# Patient Record
Sex: Female | Born: 2000 | Race: White | Hispanic: No | Marital: Single | Smoking: Never smoker
Health system: Southern US, Community
[De-identification: ages and names within clinical notes are randomized; demographics above are authoritative.]

## PROBLEM LIST (undated history)

## (undated) ENCOUNTER — Ambulatory Visit: Admission: EM

## (undated) DIAGNOSIS — L709 Acne, unspecified: Secondary | ICD-10-CM

## (undated) HISTORY — PX: NO PAST SURGERIES: SHX2092

---
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
## 2019-05-01 ENCOUNTER — Other Ambulatory Visit: Payer: Self-pay

## 2019-05-01 MED ORDER — DROSPIRENONE-ETHINYL ESTRADIOL 3-0.02 MG PO TABS: 1.0000 | ORAL_TABLET | Freq: Every day | ORAL | 0 refills | Status: DC

## 2019-05-01 NOTE — Telephone Encounter (Signed)
Pt will run out of OCP on Saturday 05/06/2019. Her apt for AE is on Monday 05/08/2019. Refill needed. 1 rf sent.

## 2019-05-05 NOTE — Progress Notes (Signed)
PCP:  Patient, No Pcp Per   Chief Complaint  Patient presents with  . Gynecologic Exam     HPI:      Ashley Rodgers is a 18 y.o. G0P0000 who LMP was Patient's last menstrual period was 05/04/2019 (exact date)., presents today for 1 yr BC follow up for dysmenorrhea/accutane use.  Her menses are regular every 28-30 days, lasting 4-5 days, flow improved.  Dysmenorrhea mild, improved with OCPs. Takes NSAIDs with improvement. She does not have intermenstrual bleeding. Likes OCPs overall. No side effects. No longer on accutane.  Sex activity: never sexually active.  Last Pap: N/A Hx of STDs: none  There is no FH of breast cancer. There is no FH of ovarian cancer. She does not do SBE.  Tobacco use: The patient denies current or previous tobacco use. Alcohol use: none No drug use.  Exercise: moderately active  She does get adequate calcium and Vitamin D in her diet. Gardasil declined in past.  Past Medical History:  Diagnosis Date  . Acne     Past Surgical History:  Procedure Laterality Date  . NO PAST SURGERIES      Family History  Problem Relation Age of Onset  . Dysmenorrhea Mother   . Anxiety disorder Father   . Breast cancer Neg Hx   . Ovarian cancer Neg Hx     Social History   Socioeconomic History  . Marital status: Single    Spouse name: Not on file  . Number of children: Not on file  . Years of education: Not on file  . Highest education level: Not on file  Occupational History  . Not on file  Social Needs  . Financial resource strain: Not on file  . Food insecurity    Worry: Not on file    Inability: Not on file  . Transportation needs    Medical: Not on file    Non-medical: Not on file  Tobacco Use  . Smoking status: Never Smoker  . Smokeless tobacco: Never Used  Substance and Sexual Activity  . Alcohol use: Never    Frequency: Never  . Drug use: Never  . Sexual activity: Never    Birth control/protection: Pill  Lifestyle  .  Physical activity    Days per week: Not on file    Minutes per session: Not on file  . Stress: Not on file  Relationships  . Social Musicianconnections    Talks on phone: Not on file    Gets together: Not on file    Attends religious service: Not on file    Active member of club or organization: Not on file    Attends meetings of clubs or organizations: Not on file    Relationship status: Not on file  . Intimate partner violence    Fear of current or ex partner: Not on file    Emotionally abused: Not on file    Physically abused: Not on file    Forced sexual activity: Not on file  Other Topics Concern  . Not on file  Social History Narrative  . Not on file    Outpatient Medications Prior to Visit  Medication Sig Dispense Refill  . drospirenone-ethinyl estradiol (YAZ) 3-0.02 MG tablet Take 1 tablet by mouth daily. 1 Package 0  . ipratropium (ATROVENT) 0.06 % nasal spray Place 2 sprays into both nostrils 4 (four) times daily as needed for rhinitis. 15 mL 0  . ISOTRETINOIN PO Take by mouth.  No facility-administered medications prior to visit.     ROS:  Review of Systems  Constitutional: Negative for fatigue, fever and unexpected weight change.  Respiratory: Negative for cough, shortness of breath and wheezing.   Cardiovascular: Negative for chest pain, palpitations and leg swelling.  Gastrointestinal: Negative for blood in stool, constipation, diarrhea, nausea and vomiting.  Endocrine: Negative for cold intolerance, heat intolerance and polyuria.  Genitourinary: Negative for dyspareunia, dysuria, flank pain, frequency, genital sores, hematuria, menstrual problem, pelvic pain, urgency, vaginal bleeding, vaginal discharge and vaginal pain.  Musculoskeletal: Negative for back pain, joint swelling and myalgias.  Skin: Negative for rash.  Neurological: Negative for dizziness, syncope, light-headedness, numbness and headaches.  Hematological: Negative for adenopathy.   Psychiatric/Behavioral: Negative for agitation, confusion, sleep disturbance and suicidal ideas. The patient is not nervous/anxious.    BREAST: No symptoms   Objective: BP 102/68   Ht 5\' 10"  (1.778 m)   Wt 140 lb 9.6 oz (63.8 kg)   LMP 05/04/2019 (Exact Date)   BMI 20.17 kg/m    Physical Exam Constitutional:      Appearance: She is well-developed.  Neck:     Musculoskeletal: Normal range of motion.  Pulmonary:     Effort: Pulmonary effort is normal.  Musculoskeletal: Normal range of motion.  Neurological:     Mental Status: She is alert and oriented to person, place, and time.  Skin:    General: Skin is warm.  Psychiatric:        Behavior: Behavior normal.        Thought Content: Thought content normal.   GYN EXAM DEFERRED SINCE NEVER SEX ACTIVE   Assessment/Plan: Encounter for surveillance of contraceptive pills - Plan: drospirenone-ethinyl estradiol (YAZ) 3-0.02 MG tablet, Doing well, wants to cont. Rx RF. F/u prn.  Dysmenorrhea in adolescent - Plan: drospirenone-ethinyl estradiol (YAZ) 3-0.02 MG tablet, Improved with OCPs.   Meds ordered this encounter  Medications  . drospirenone-ethinyl estradiol (YAZ) 3-0.02 MG tablet    Sig: Take 1 tablet by mouth daily.    Dispense:  3 Package    Refill:  3    Order Specific Question:   Supervising Provider    Answer:   Gae Dry [[id number]]             GYN counsel use and side effects of OCP's, adequate intake of calcium and vitamin D, Gardasil     F/U  Return in about 1 year (around 05/07/2020).  Khing Belcher B. Ithzel Fedorchak, PA-C 05/08/2019 8:35 AM

## 2019-05-08 ENCOUNTER — Ambulatory Visit (INDEPENDENT_AMBULATORY_CARE_PROVIDER_SITE_OTHER): Payer: BC Managed Care – PPO | Admitting: Obstetrics and Gynecology

## 2019-05-08 ENCOUNTER — Other Ambulatory Visit: Payer: Self-pay

## 2019-05-08 ENCOUNTER — Encounter: Payer: Self-pay | Admitting: Obstetrics and Gynecology

## 2019-05-08 VITALS — BP 102/68 | Ht 70.0 in | Wt 140.6 lb

## 2019-05-08 DIAGNOSIS — Z3041 Encounter for surveillance of contraceptive pills: Secondary | ICD-10-CM | POA: Diagnosis not present

## 2019-05-08 DIAGNOSIS — N946 Dysmenorrhea, unspecified: Secondary | ICD-10-CM | POA: Diagnosis not present

## 2019-05-08 MED ORDER — DROSPIRENONE-ETHINYL ESTRADIOL 3-0.02 MG PO TABS: 1.0000 | ORAL_TABLET | Freq: Every day | ORAL | 3 refills | Status: DC

## 2019-05-08 NOTE — Patient Instructions (Signed)
I value your feedback and entrusting us with your care. If you get a [facility] patient survey, I would appreciate you taking the time to let us know about your experience today. Thank you! 

## 2019-05-30 IMAGING — DX DG FEMUR 2+V*L*
5 series · 5 of 5 positions shown · non-contrast
Comparison: None

CLINICAL DATA: Knot at lateral side of LEFT femur when running, no
known injury

EXAM:
LEFT FEMUR 2 VIEWS

[femur ap (1 of 3)]
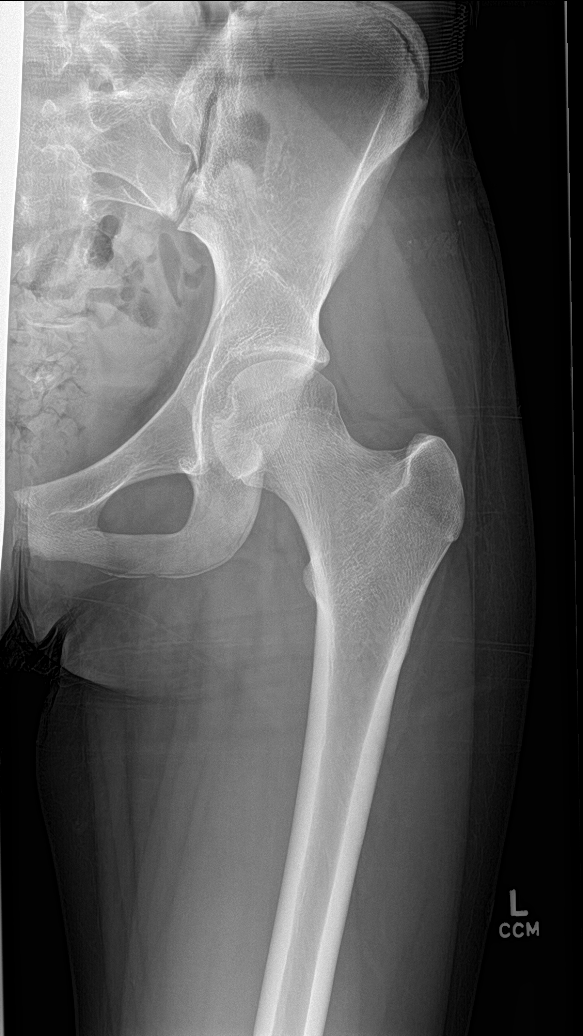

[femur ap (2 of 3)]
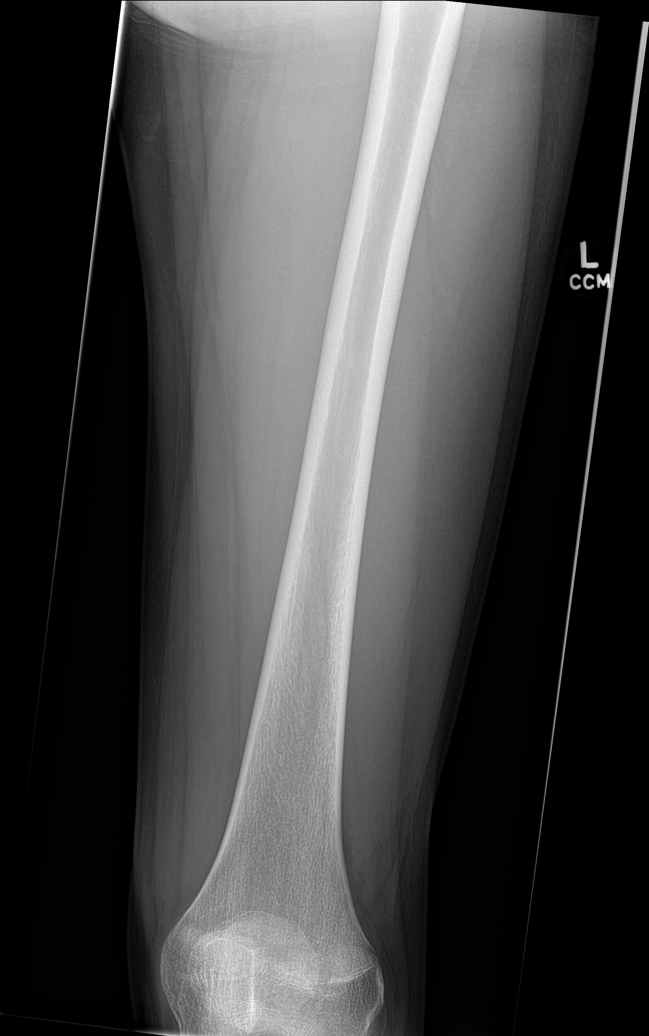

[femur lat (1 of 2)]
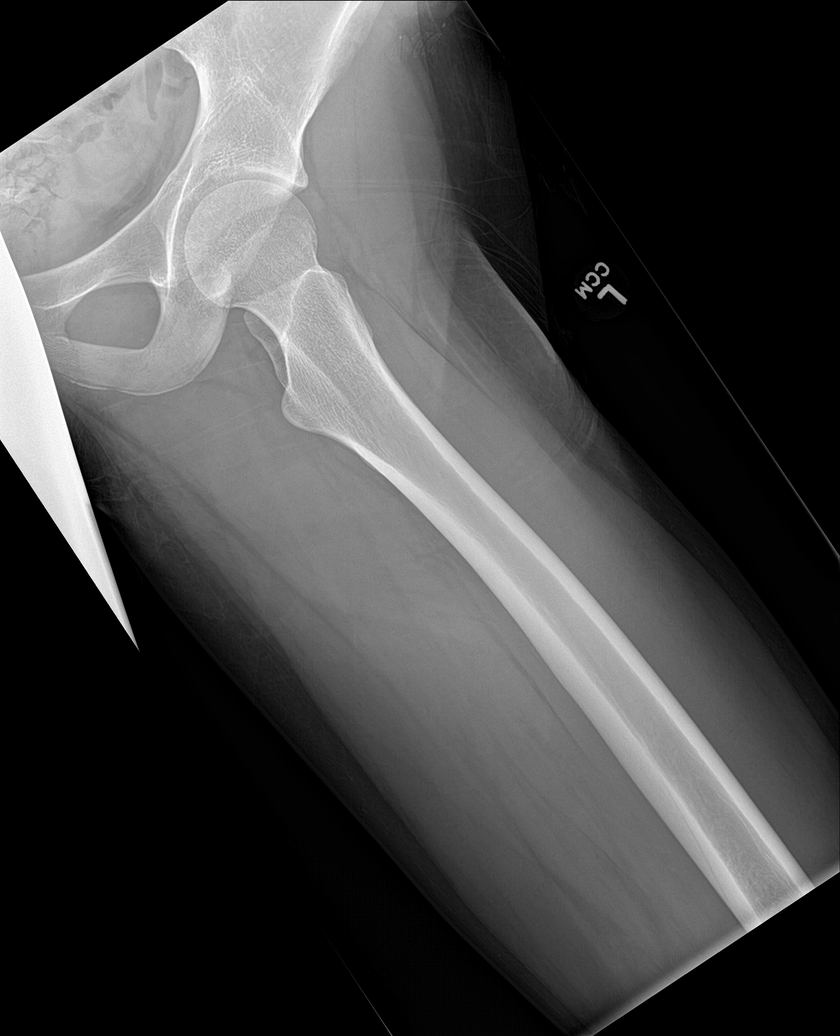

[femur lat (2 of 2)]
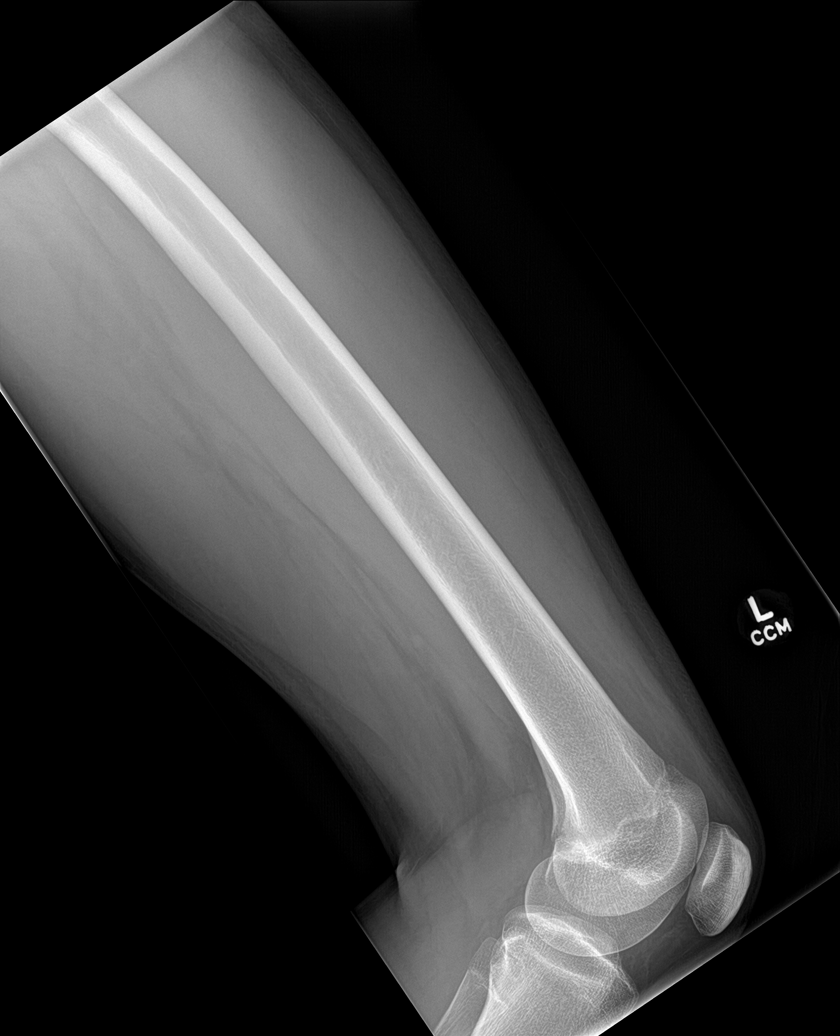

[femur ap (3 of 3)]
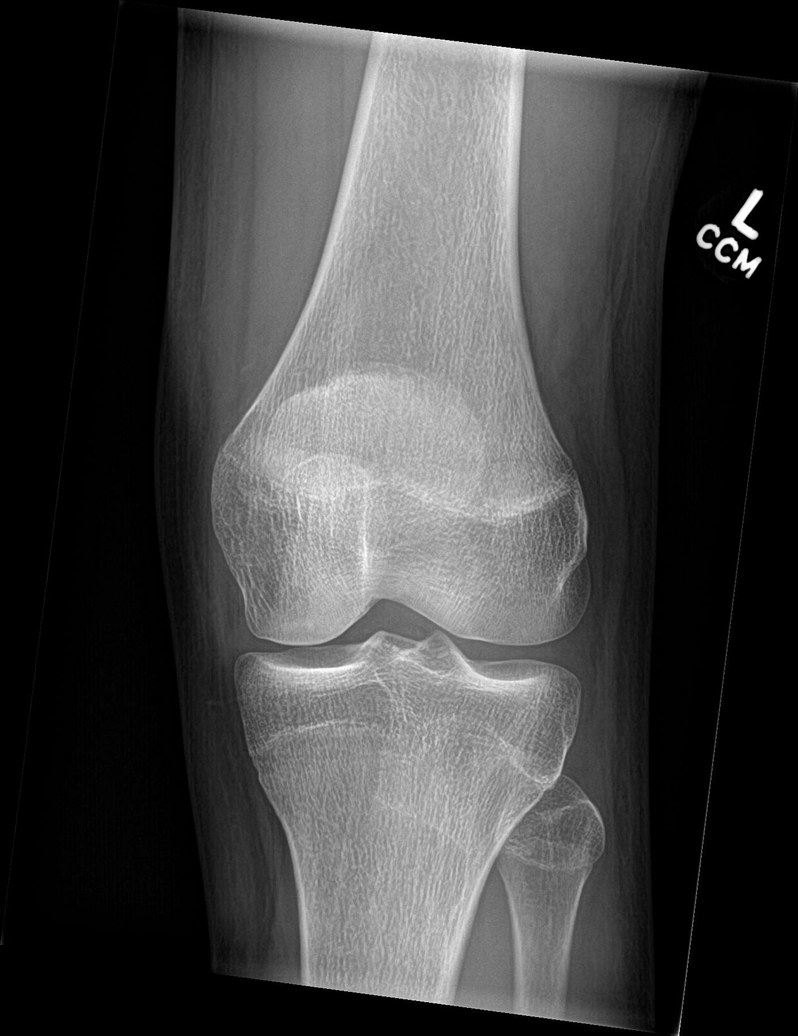

[5 of 5 positions shown; findings below may reference images not displayed]

FINDINGS: Osseous mineralization normal.

LEFT SI, hip, and knee joint spaces preserved.

No acute fracture, dislocation, or bone destruction.

No radiographic soft tissue abnormalities identified.
IMPRESSION: Normal exam.

## 2020-04-25 ENCOUNTER — Other Ambulatory Visit: Payer: Self-pay

## 2020-04-25 DIAGNOSIS — N946 Dysmenorrhea, unspecified: Secondary | ICD-10-CM

## 2020-04-25 DIAGNOSIS — Z3041 Encounter for surveillance of contraceptive pills: Secondary | ICD-10-CM

## 2020-04-25 MED ORDER — DROSPIRENONE-ETHINYL ESTRADIOL 3-0.02 MG PO TABS: 1.0000 | ORAL_TABLET | Freq: Every day | ORAL | 0 refills | Status: DC

## 2020-04-25 NOTE — Telephone Encounter (Signed)
Annual scheduled for 8/13 with [facility]. Needs BC RF to get to her appt. RF sent.

## 2020-04-29 ENCOUNTER — Telehealth: Payer: Self-pay

## 2020-04-29 NOTE — Telephone Encounter (Signed)
Pt calling for ref on bc; has appt 8/13.  [phone]  Adv pt ref was eRx'd on 7/29; to call pharm.

## 2020-05-09 NOTE — Progress Notes (Signed)
PCP:  Patient, No Pcp Per   Chief Complaint  Patient presents with  . Gynecologic Exam    No complaints     HPI:      [name] is a 19 y.o. G0P0000 who LMP was Patient's last menstrual period was 04/21/2020., presents today for 1 yr BC follow up for dysmenorrhea.  Her menses are regular every 28-30 days, lasting 4-5 days, flow improved.  Dysmenorrhea mild, improved with OCPs. Takes NSAIDs with improvement. She does not have intermenstrual bleeding.   Sex activity: never sexually active.  Last Pap: N/A Hx of STDs: none  There is no FH of breast cancer. There is no FH of ovarian cancer. She does not do SBE.  Tobacco use: The patient denies current or previous tobacco use. Alcohol use: none No drug use.  Exercise: moderately active  She does get adequate calcium and Vitamin D in her diet. Gardasil declined in past.  Past Medical History:  Diagnosis Date  . Acne     Past Surgical History:  Procedure Laterality Date  . NO PAST SURGERIES      Family History  Problem Relation Age of Onset  . Dysmenorrhea Mother   . Anxiety disorder Father   . Breast cancer Neg Hx   . Ovarian cancer Neg Hx     Social History   Socioeconomic History  . Marital status: Single    Spouse name: Not on file  . Number of children: Not on file  . Years of education: Not on file  . Highest education level: Not on file  Occupational History  . Not on file  Tobacco Use  . Smoking status: Never Smoker  . Smokeless tobacco: Never Used  Vaping Use  . Vaping Use: Never used  Substance and Sexual Activity  . Alcohol use: Never  . Drug use: Never  . Sexual activity: Never    Birth control/protection: Pill  Other Topics Concern  . Not on file  Social History Narrative  . Not on file   Social Determinants of Health   Financial Resource Strain:   . Difficulty of Paying Living Expenses:   Food Insecurity:   . Worried About Programme researcher, broadcasting/film/video in the Last Year:   . Occupational psychologist in the Last Year:   Transportation Needs:   . Freight forwarder (Medical):   Marland Kitchen Lack of Transportation (Non-Medical):   Physical Activity:   . Days of Exercise per Week:   . Minutes of Exercise per Session:   Stress:   . Feeling of Stress :   Social Connections:   . Frequency of Communication with Friends and Family:   . Frequency of Social Gatherings with Friends and Family:   . Attends Religious Services:   . Active Member of Clubs or Organizations:   . Attends Banker Meetings:   Marland Kitchen Marital Status:   Intimate Partner Violence:   . Fear of Current or Ex-Partner:   . Emotionally Abused:   Marland Kitchen Physically Abused:   . Sexually Abused:     Outpatient Medications Prior to Visit  Medication Sig Dispense Refill  . drospirenone-ethinyl estradiol (YAZ) 3-0.02 MG tablet Take 1 tablet by mouth daily. 28 tablet 0   No facility-administered medications prior to visit.    ROS:  Review of Systems  Constitutional: Negative for fatigue, fever and unexpected weight change.  Respiratory: Negative for cough, shortness of breath and wheezing.   Cardiovascular: Negative for chest pain,  palpitations and leg swelling.  Gastrointestinal: Negative for blood in stool, constipation, diarrhea, nausea and vomiting.  Endocrine: Negative for cold intolerance, heat intolerance and polyuria.  Genitourinary: Negative for dyspareunia, dysuria, flank pain, frequency, genital sores, hematuria, menstrual problem, pelvic pain, urgency, vaginal bleeding, vaginal discharge and vaginal pain.  Musculoskeletal: Negative for back pain, joint swelling and myalgias.  Skin: Negative for rash.  Neurological: Negative for dizziness, syncope, light-headedness, numbness and headaches.  Hematological: Negative for adenopathy.  Psychiatric/Behavioral: Negative for agitation, confusion, sleep disturbance and suicidal ideas. The patient is not nervous/anxious.    BREAST: No  symptoms   Objective: BP 110/62 (BP Location: Left Arm, Patient Position: Sitting, Cuff Size: Normal)   Pulse 86   Ht 5\' 11"  (1.803 m)   Wt 144 lb (65.3 kg)   LMP 04/21/2020   BMI 20.08 kg/m    Physical Exam Constitutional:      Appearance: She is well-developed.  Cardiovascular:     Rate and Rhythm: Normal rate and regular rhythm.  Pulmonary:     Effort: Pulmonary effort is normal.  Musculoskeletal:        General: Normal range of motion.     Cervical back: Normal range of motion.  Neurological:     Mental Status: She is alert and oriented to person, place, and time.  Skin:    General: Skin is warm.  Psychiatric:        Behavior: Behavior normal.        Thought Content: Thought content normal.   GYN EXAM DEFERRED SINCE NEVER SEX ACTIVE   Assessment/Plan: Encounter for surveillance of contraceptive pills - Plan: drospirenone-ethinyl estradiol (YAZ) 3-0.02 MG tablet, Doing well, wants to cont. Rx RF. F/u prn.  Dysmenorrhea in adolescent - Plan: drospirenone-ethinyl estradiol (YAZ) 3-0.02 MG tablet, Improved with OCPs.   Meds ordered this encounter  Medications  . drospirenone-ethinyl estradiol (YAZ) 3-0.02 MG tablet    Sig: Take 1 tablet by mouth daily.    Dispense:  84 tablet    Refill:  3    Order Specific Question:   Supervising Provider    Answer:   10-15-1997 Nadara Mustard             GYN counsel use and side effects of OCP's, adequate intake of calcium and vitamin D, Gardasil     F/U  Return in about 1 year (around 05/10/2021).  Willadene Mounsey B. Alfio Loescher, PA-C 05/10/2020 8:54 AM

## 2020-05-09 NOTE — Patient Instructions (Signed)
I value your feedback and entrusting us with your care. If you get a [facility] patient survey, I would appreciate you taking the time to let us know about your experience today. Thank you!  As of September 07, 2019, your lab results will be released to your MyChart immediately, before I even have a chance to see them. Please give me time to review them and contact you if there are any abnormalities. Thank you for your patience.  

## 2020-05-10 ENCOUNTER — Ambulatory Visit (INDEPENDENT_AMBULATORY_CARE_PROVIDER_SITE_OTHER): Payer: BC Managed Care – PPO | Admitting: Obstetrics and Gynecology

## 2020-05-10 ENCOUNTER — Other Ambulatory Visit: Payer: Self-pay

## 2020-05-10 ENCOUNTER — Encounter: Payer: Self-pay | Admitting: Obstetrics and Gynecology

## 2020-05-10 VITALS — BP 110/62 | HR 86 | Ht 71.0 in | Wt 144.0 lb

## 2020-05-10 DIAGNOSIS — N946 Dysmenorrhea, unspecified: Secondary | ICD-10-CM | POA: Diagnosis not present

## 2020-05-10 DIAGNOSIS — Z3041 Encounter for surveillance of contraceptive pills: Secondary | ICD-10-CM

## 2020-05-10 MED ORDER — DROSPIRENONE-ETHINYL ESTRADIOL 3-0.02 MG PO TABS: 1.0000 | ORAL_TABLET | Freq: Every day | ORAL | 3 refills | Status: DC

## 2021-04-15 ENCOUNTER — Ambulatory Visit: Payer: BC Managed Care – PPO | Admitting: Obstetrics and Gynecology

## 2021-05-09 ENCOUNTER — Other Ambulatory Visit: Payer: Self-pay | Admitting: Obstetrics and Gynecology

## 2021-05-09 DIAGNOSIS — Z3041 Encounter for surveillance of contraceptive pills: Secondary | ICD-10-CM

## 2021-05-09 DIAGNOSIS — N946 Dysmenorrhea, unspecified: Secondary | ICD-10-CM

## 2021-05-12 ENCOUNTER — Other Ambulatory Visit: Payer: Self-pay

## 2021-05-12 NOTE — Telephone Encounter (Signed)
Pt calling for refill of bc; has appt scheduled 8/31st.  [phone]  Pt aware refill was sent in 8/13th.

## 2021-05-28 ENCOUNTER — Ambulatory Visit: Payer: Self-pay | Admitting: Obstetrics and Gynecology

## 2021-07-04 ENCOUNTER — Telehealth: Payer: Self-pay | Admitting: Family Medicine

## 2021-07-04 NOTE — Telephone Encounter (Signed)
Pt. Is required to have a Pre-Rabies shot for college purposes. I transferred the call to Rosey Bath to preorder, but patient or clerk needs to be contacted once the appointment can be make. Thanks

## 2021-07-07 NOTE — Telephone Encounter (Signed)
Attempted to return call to assess for Pre-exposure Rabies vaccine.  No answer, unable to leave a message, mailbox full.

## 2021-07-09 ENCOUNTER — Ambulatory Visit: Payer: Self-pay | Admitting: Obstetrics and Gynecology

## 2021-07-10 NOTE — Telephone Encounter (Signed)
Left message to return call regarding the need for pre-exposure rabies vaccines.

## 2021-07-17 ENCOUNTER — Ambulatory Visit (LOCAL_COMMUNITY_HEALTH_CENTER): Payer: Self-pay

## 2021-07-17 ENCOUNTER — Other Ambulatory Visit: Payer: Self-pay

## 2021-07-17 DIAGNOSIS — Z23 Encounter for immunization: Secondary | ICD-10-CM

## 2021-07-17 DIAGNOSIS — Z719 Counseling, unspecified: Secondary | ICD-10-CM

## 2021-07-17 NOTE — Progress Notes (Signed)
Patient in Nurse Clinic for her rabies vaccine #1 and tolerated well.  Dose #2 is scheduled for 07/24/2021 at 2:15 pm.   [facility] is down today.  Paper copy of rabies vaccine administered today given to patient.  [facility] copy will be mailed once available. Hart Carwin, RN

## 2021-07-24 ENCOUNTER — Other Ambulatory Visit: Payer: Self-pay

## 2021-07-24 ENCOUNTER — Ambulatory Visit (LOCAL_COMMUNITY_HEALTH_CENTER): Payer: Self-pay

## 2021-07-24 DIAGNOSIS — Z23 Encounter for immunization: Secondary | ICD-10-CM

## 2021-07-24 DIAGNOSIS — Z719 Counseling, unspecified: Secondary | ICD-10-CM

## 2021-07-24 NOTE — Progress Notes (Signed)
Rabavert # 2 administered with no problems.  [facility] copy given to patient. Hart Carwin, RN

## 2021-08-06 ENCOUNTER — Ambulatory Visit: Payer: Self-pay | Admitting: Obstetrics and Gynecology

## 2021-09-08 ENCOUNTER — Encounter: Payer: Self-pay | Admitting: Obstetrics and Gynecology

## 2021-09-08 ENCOUNTER — Ambulatory Visit (INDEPENDENT_AMBULATORY_CARE_PROVIDER_SITE_OTHER): Payer: BC Managed Care – PPO | Admitting: Obstetrics and Gynecology

## 2021-09-08 ENCOUNTER — Other Ambulatory Visit: Payer: Self-pay

## 2021-09-08 ENCOUNTER — Other Ambulatory Visit (HOSPITAL_COMMUNITY)
Admission: RE | Admit: 2021-09-08 | Discharge: 2021-09-08 | Disposition: A | Discharge status: Home / Self Care | Payer: BC Managed Care – PPO | Source: Ambulatory Visit | Attending: Obstetrics and Gynecology | Admitting: Obstetrics and Gynecology

## 2021-09-08 VITALS — BP 140/60 | Ht 71.0 in | Wt 159.0 lb

## 2021-09-08 DIAGNOSIS — N946 Dysmenorrhea, unspecified: Secondary | ICD-10-CM | POA: Diagnosis not present

## 2021-09-08 DIAGNOSIS — Z01419 Encounter for gynecological examination (general) (routine) without abnormal findings: Secondary | ICD-10-CM

## 2021-09-08 DIAGNOSIS — Z124 Encounter for screening for malignant neoplasm of cervix: Secondary | ICD-10-CM | POA: Insufficient documentation

## 2021-09-08 DIAGNOSIS — Z3041 Encounter for surveillance of contraceptive pills: Secondary | ICD-10-CM

## 2021-09-08 MED ORDER — DROSPIRENONE-ETHINYL ESTRADIOL 3-0.02 MG PO TABS: 1.0000 | ORAL_TABLET | Freq: Every day | ORAL | 3 refills | Status: AC

## 2021-09-08 NOTE — Patient Instructions (Signed)
I value your feedback and you entrusting us with your care. If you get a [facility] patient survey, I would appreciate you taking the time to let us know about your experience today. Thank you! ? ? ?

## 2021-09-08 NOTE — Progress Notes (Signed)
PCP:  Ellene Route   Chief Complaint  Patient presents with   Gynecologic Exam    No concerns     HPI:      [name] is a 20 y.o. G0P0000 who LMP was Patient's last menstrual period was 09/03/2021 (exact date)., presents today for her annual exam. Her menses are regular every 28-30 days, lasting 5-6 days, flow improved.  Dysmenorrhea mild, improved with OCPs. Takes NSAIDs with improvement. She does not have intermenstrual bleeding.   Sex activity: never sexually active.  Last Pap: N/A in past due to age Hx of STDs: none  There is no FH of breast cancer. There is no FH of ovarian cancer. She does not do SBE.  Tobacco use: The patient denies current or previous tobacco use. Alcohol use: none No drug use.  Exercise: moderately active  She does get adequate calcium and Vitamin D in her diet. Gardasil declined in past.  Past Medical History:  Diagnosis Date   Acne     Past Surgical History:  Procedure Laterality Date   NO PAST SURGERIES      Family History  Problem Relation Age of Onset   Dysmenorrhea Mother    Anxiety disorder Father    Breast cancer Neg Hx    Ovarian cancer Neg Hx     Social History   Socioeconomic History   Marital status: Single    Spouse name: Not on file   Number of children: Not on file   Years of education: Not on file   Highest education level: Not on file  Occupational History   Not on file  Tobacco Use   Smoking status: Never   Smokeless tobacco: Never  Vaping Use   Vaping Use: Never used  Substance and Sexual Activity   Alcohol use: Never   Drug use: Never   Sexual activity: Never    Birth control/protection: Pill  Other Topics Concern   Not on file  Social History Narrative   Not on file   Social Determinants of Health   Financial Resource Strain: Not on file  Food Insecurity: Not on file  Transportation Needs: Not on file  Physical Activity: Not on file  Stress: Not on file  Social  Connections: Not on file  Intimate Partner Violence: Not on file    Outpatient Medications Prior to Visit  Medication Sig Dispense Refill   escitalopram (LEXAPRO) 10 MG tablet Take 10 mg by mouth daily.     drospirenone-ethinyl estradiol (YAZ) 3-0.02 MG tablet Take 1 tablet by mouth daily. 84 tablet 0   No facility-administered medications prior to visit.    ROS:  Review of Systems  Constitutional:  Negative for fatigue, fever and unexpected weight change.  Respiratory:  Negative for cough, shortness of breath and wheezing.   Cardiovascular:  Negative for chest pain, palpitations and leg swelling.  Gastrointestinal:  Negative for blood in stool, constipation, diarrhea, nausea and vomiting.  Endocrine: Negative for cold intolerance, heat intolerance and polyuria.  Genitourinary:  Negative for dyspareunia, dysuria, flank pain, frequency, genital sores, hematuria, menstrual problem, pelvic pain, urgency, vaginal bleeding, vaginal discharge and vaginal pain.  Musculoskeletal:  Negative for back pain, joint swelling and myalgias.  Skin:  Negative for rash.  Neurological:  Negative for dizziness, syncope, light-headedness, numbness and headaches.  Hematological:  Negative for adenopathy.  Psychiatric/Behavioral:  Negative for agitation, confusion, sleep disturbance and suicidal ideas. The patient is not nervous/anxious.   BREAST: No symptoms   Objective:  BP 140/60   Ht 5\' 11"  (1.803 m)   Wt 159 lb (72.1 kg)   LMP 09/03/2021 (Exact Date)   BMI 22.18 kg/m    Physical Exam Constitutional:      Appearance: She is well-developed.  Genitourinary:     Vulva normal.     Right Labia: No rash, tenderness or lesions.    Left Labia: No tenderness, lesions or rash.    No vaginal discharge, erythema or tenderness.      Right Adnexa: not tender and no mass present.    Left Adnexa: not tender and no mass present.    No cervical friability or polyp.     Uterus is not enlarged or tender.   Breasts:    Right: No mass, nipple discharge, skin change or tenderness.     Left: No mass, nipple discharge, skin change or tenderness.  Neck:     Thyroid: No thyromegaly.  Cardiovascular:     Rate and Rhythm: Normal rate and regular rhythm.     Heart sounds: Normal heart sounds. No murmur heard. Pulmonary:     Effort: Pulmonary effort is normal.     Breath sounds: Normal breath sounds.  Abdominal:     Palpations: Abdomen is soft.     Tenderness: There is no abdominal tenderness. There is no guarding or rebound.  Musculoskeletal:        General: Normal range of motion.     Cervical back: Normal range of motion.  Lymphadenopathy:     Cervical: No cervical adenopathy.  Neurological:     General: No focal deficit present.     Mental Status: She is alert and oriented to person, place, and time.     Cranial Nerves: No cranial nerve deficit.  Skin:    General: Skin is warm and dry.  Psychiatric:        Mood and Affect: Mood normal.        Behavior: Behavior normal.        Thought Content: Thought content normal.        Judgment: Judgment normal.  Vitals reviewed.    Assessment/Plan: Encounter for annual routine gynecological examination  Cervical cancer screening - Plan: Cytology - PAP  Encounter for surveillance of contraceptive pills - Plan: drospirenone-ethinyl estradiol (YAZ) 3-0.02 MG tablet; OCP RF.   Dysmenorrhea - Plan: drospirenone-ethinyl estradiol (YAZ) 3-0.02 MG tablet   Meds ordered this encounter  Medications   drospirenone-ethinyl estradiol (YAZ) 3-0.02 MG tablet    Sig: Take 1 tablet by mouth daily.    Dispense:  84 tablet    Refill:  3    Order Specific Question:   Supervising Provider    Answer:   [birth year]1996 Nadara Mustard              GYN counsel adequate intake of calcium and vitamin D     F/U  Return in about 1 year (around 09/08/2022).  Hoby Kawai B. Darrious Youman, PA-C 09/08/2021 3:17 PM

## 2021-09-10 LAB — CYTOLOGY - PAP
Adequacy: ABSENT
Diagnosis: NEGATIVE

## 2023-01-23 ENCOUNTER — Other Ambulatory Visit: Payer: Self-pay | Admitting: Obstetrics and Gynecology

## 2023-01-23 DIAGNOSIS — N946 Dysmenorrhea, unspecified: Secondary | ICD-10-CM

## 2023-01-23 DIAGNOSIS — Z3041 Encounter for surveillance of contraceptive pills: Secondary | ICD-10-CM

## 2023-10-11 ENCOUNTER — Ambulatory Visit
Admission: EM | Admit: 2023-10-11 | Discharge: 2023-10-11 | Disposition: A | Discharge status: Home / Self Care | Payer: 59 | Attending: Emergency Medicine | Admitting: Emergency Medicine

## 2023-10-11 ENCOUNTER — Encounter: Payer: Self-pay | Admitting: Emergency Medicine

## 2023-10-11 DIAGNOSIS — J0100 Acute maxillary sinusitis, unspecified: Secondary | ICD-10-CM

## 2023-10-11 MED ORDER — PROMETHAZINE-DM 6.25-15 MG/5ML PO SYRP: 5.0000 mL | ORAL_SOLUTION | Freq: Four times a day (QID) | ORAL | 0 refills | Status: AC | PRN

## 2023-10-11 MED ORDER — NAPROXEN 500 MG PO TABS: 500.0000 mg | ORAL_TABLET | Freq: Two times a day (BID) | ORAL | 0 refills | Status: AC

## 2023-10-11 MED ORDER — AMOXICILLIN-POT CLAVULANATE 875-125 MG PO TABS: 1.0000 | ORAL_TABLET | Freq: Two times a day (BID) | ORAL | 0 refills | Status: AC

## 2023-10-11 MED ORDER — FLUTICASONE PROPIONATE 50 MCG/ACT NA SUSP: 2.0000 | Freq: Every day | NASAL | 0 refills | Status: AC

## 2023-10-11 NOTE — ED Provider Notes (Signed)
 HPI  SUBJECTIVE:  Ashley Rodgers is a 23 y.o. female who presents with 6 days of fevers Tmax 102.7, nausea and vomiting for the first 2 days, which has resolved.  She reports sore throat, nasal congestion, green rhinorrhea, cough productive of greenish sputum.  She reports right sided sinus pain and pressure worse with bending forward.  She reports upper dental pain, postnasal drip.  She had a headache at first, but this has also resolved.  No body aches, wheezing, chest pain, shortness of breath, abdominal pain.  No known COVID or flu exposure.  She got 2 doses of the COVID-vaccine.  She did not get this years flu vaccine.  She is unable to sleep at night because of the cough.  No antipyretic in the past 6 hours.  No antibiotics in the past month.  She tried sinus -x and Mucinex.  The Mucinex helps.  Symptoms are worse with bending forward.  She has no past medical history.  LMP: 1/1.  Denies the possibility of being pregnant.  [facility]    Past Medical History:  Diagnosis Date   Acne     Past Surgical History:  Procedure Laterality Date   NO PAST SURGERIES      Family History  Problem Relation Age of Onset   Dysmenorrhea Mother    Anxiety disorder Father    Breast cancer Neg Hx    Ovarian cancer Neg Hx     Social History   Tobacco Use   Smoking status: Never   Smokeless tobacco: Never  Vaping Use   Vaping status: Never Used  Substance Use Topics   Alcohol use: Never   Drug use: Never    No current facility-administered medications for this encounter.  Current Outpatient Medications:    amoxicillin -clavulanate (AUGMENTIN ) 875-125 MG tablet, Take 1 tablet by mouth every 12 (twelve) hours., Disp: 14 tablet, Rfl: 0   fluticasone  (FLONASE ) 50 MCG/ACT nasal spray, Place 2 sprays into both nostrils daily., Disp: 16 g, Rfl: 0   naproxen  (NAPROSYN ) 500 MG tablet, Take 1 tablet (500 mg total) by mouth 2 (two) times daily., Disp: 20 tablet, Rfl: 0    promethazine -dextromethorphan (PROMETHAZINE -DM) 6.25-15 MG/5ML syrup, Take 5 mLs by mouth 4 (four) times daily as needed for cough., Disp: 118 mL, Rfl: 0   drospirenone -ethinyl estradiol  (YAZ) 3-0.02 MG tablet, Take 1 tablet by mouth daily., Disp: 84 tablet, Rfl: 3   escitalopram (LEXAPRO) 10 MG tablet, Take 10 mg by mouth daily., Disp: , Rfl:   No Known Allergies   ROS  As noted in HPI.   Physical Exam  BP 126/63 (BP Location: Left Arm)   Pulse 79   Temp 99.5 F (37.5 C) (Oral)   Resp 18   LMP 09/29/2023   SpO2 100%   Constitutional: Well developed, well nourished, no acute distress Eyes:  EOMI, conjunctiva normal bilaterally HENT: Normocephalic, atraumatic,mucus membranes moist.  Positive nasal congestion.  Positive maxillary sinus tenderness, worse on the right.  No frontal sinus tenderness.  Normal oropharynx.  Normal size without exudates.  Uvula midline. Neck: Positive cervical lymphadenopathy Respiratory: Normal inspiratory effort, lungs clear bilaterally Cardiovascular: Normal rate, regular rhythm, no murmurs rubs or gallops GI: nondistended skin: No rash, skin intact Musculoskeletal: no deformities Neurologic: Alert & oriented x 3, no focal neuro deficits Psychiatric: Speech and behavior appropriate   ED Course   Medications - No data to display  No orders of the defined types were placed in this encounter.  No results found for this or any previous visit (from the past 24 hours). No results found.  ED Clinical Impression  1. Acute non-recurrent maxillary sinusitis      ED Assessment/Plan     She is on day #6 of symptoms, thus we did not check COVID or flu as she is out of the treatment window for both.  She meets criteria for antibiotics for sinus infection with the fevers above 102, dental pain.  Will send home with Augmentin , Flonase , saline nasal irrigation, Promethazine  DM, Naprosyn /Tylenol.  Work for note for 2 days.  Follow-up with PCP as  needed  Discussed MDM, treatment plan, and plan for follow-up with patient.patient agrees with plan.   Meds ordered this encounter  Medications   fluticasone  (FLONASE ) 50 MCG/ACT nasal spray    Sig: Place 2 sprays into both nostrils daily.    Dispense:  16 g    Refill:  0   amoxicillin -clavulanate (AUGMENTIN ) 875-125 MG tablet    Sig: Take 1 tablet by mouth every 12 (twelve) hours.    Dispense:  14 tablet    Refill:  0   naproxen  (NAPROSYN ) 500 MG tablet    Sig: Take 1 tablet (500 mg total) by mouth 2 (two) times daily.    Dispense:  20 tablet    Refill:  0   promethazine -dextromethorphan (PROMETHAZINE -DM) 6.25-15 MG/5ML syrup    Sig: Take 5 mLs by mouth 4 (four) times daily as needed for cough.    Dispense:  118 mL    Refill:  0      *This clinic note was created using Scientist, clinical (histocompatibility and immunogenetics). Therefore, there may be occasional mistakes despite careful proofreading.  ?    Van Knee, MD 10/11/23 [phone]

## 2023-10-11 NOTE — ED Triage Notes (Signed)
 Patient c/o nasal congestion, productive cough, green in color, and fever.

## 2023-10-11 NOTE — Discharge Instructions (Signed)
 Start Mucinex-D to keep the mucous thin and to decongest you.   You may take the Naprosyn  with 1000 mg of Tylenol twice a day as needed for pain, headache, fever.  This is an effective combination for pain.  Finish the antibiotics, even if you feel better.  Promethazine  DM as needed for cough. Use a NeilMed sinus rinse with distilled water as often as you want to to reduce nasal congestion. Follow the directions on the box.   Go to www.goodrx.com to look up your medications. This will give you a list of where you can find your prescriptions at the most affordable prices. Or you can ask the pharmacist what the cash price is. This is frequently cheaper than going through insurance.

## 2024-06-14 ENCOUNTER — Encounter: Payer: Self-pay | Admitting: Emergency Medicine

## 2024-06-14 ENCOUNTER — Ambulatory Visit
Admission: EM | Admit: 2024-06-14 | Discharge: 2024-06-14 | Disposition: A | Discharge status: Home / Self Care | Attending: Family Medicine | Admitting: Family Medicine

## 2024-06-14 DIAGNOSIS — L241 Irritant contact dermatitis due to oils and greases: Secondary | ICD-10-CM

## 2024-06-14 DIAGNOSIS — R21 Rash and other nonspecific skin eruption: Secondary | ICD-10-CM | POA: Diagnosis not present

## 2024-06-14 MED ORDER — TRIAMCINOLONE ACETONIDE 0.1 % EX OINT: 1.0000 | TOPICAL_OINTMENT | Freq: Two times a day (BID) | CUTANEOUS | 0 refills | Status: AC

## 2024-06-14 MED ORDER — PREDNISONE 10 MG (21) PO TBPK: ORAL_TABLET | Freq: Every day | ORAL | 0 refills | Status: AC

## 2024-06-14 MED ORDER — DOXYCYCLINE HYCLATE 100 MG PO CAPS: 100.0000 mg | ORAL_CAPSULE | Freq: Two times a day (BID) | ORAL | 0 refills | Status: AC

## 2024-06-14 MED ORDER — MUPIROCIN 2 % EX OINT: 1.0000 | TOPICAL_OINTMENT | Freq: Two times a day (BID) | CUTANEOUS | 0 refills | Status: AC

## 2024-06-14 NOTE — ED Triage Notes (Signed)
 Pt presents with bug bites on bilateral legs x 1 week. Pt has animals that she feeds outside and was in some tall grass.

## 2024-06-14 NOTE — ED Provider Notes (Signed)
 [facility]    CSN: [id number] Arrival date & time: 06/14/24  1043      History   Chief Complaint Chief Complaint  Patient presents with   Insect Bite    HPI Ashley Rodgers is a 23 y.o. female.   HPI  [name] presents for 3 days of bilateral leg lesions that are getting worse.  She walks through some tall grass to feed her pigs and donkeys. Has a lot of itching.  Tried some OTC steroid cream and calamine lotion.  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     There is been no new products including soaps, lotions,  and detergents.  No eye irritation, sore throat, difficulty breathing, nausea, vomiting or diarrhea.  Denies belly pain, joint pain and fever.  There has been no medication changes or new supplements.       Past Medical History:  Diagnosis Date   Acne     Patient Active Problem List   Diagnosis Date Noted   Dysmenorrhea in adolescent 04/11/2018   Acne vulgaris 04/11/2018   Left leg pain 12/21/2017    Past Surgical History:  Procedure Laterality Date   NO PAST SURGERIES      OB History     Gravida  0   Para  0   Term  0   Preterm  0   AB  0   Living  0      SAB  0   IAB  0   Ectopic  0   Multiple  0   Live Births  0            Home Medications    Prior to Admission medications   Medication Sig Start Date End Date Taking? Authorizing Provider  doxycycline  (VIBRAMYCIN ) 100 MG capsule Take 1 capsule (100 mg total) by mouth 2 (two) times daily. 06/14/24  Yes Yarel Rushlow, DO  mupirocin  ointment (BACTROBAN ) 2 % Apply 1 Application topically 2 (two) times daily. 06/14/24  Yes Garren Greenman, DO  predniSONE  (STERAPRED UNI-PAK 21 TAB) 10 MG (21) TBPK tablet Take by mouth daily. Take 6 tabs by mouth daily for 1, then 5 tabs for 1 day, then 4 tabs for 1 day, then 3 tabs for 1 day, then 2 tabs for 1 day, then 1 tab for 1 day. 06/14/24  Yes Norell Brisbin, DO  triamcinolone  ointment (KENALOG ) 0.1 % Apply 1  Application topically 2 (two) times daily. 06/14/24  Yes Kemari Mares, DO  amoxicillin -clavulanate (AUGMENTIN ) 875-125 MG tablet Take 1 tablet by mouth every 12 (twelve) hours. 10/11/23   Van Knee, MD  drospirenone -ethinyl estradiol  (YAZ) 3-0.02 MG tablet Take 1 tablet by mouth daily. 09/08/21   Copland, Alicia B, PA-C  escitalopram (LEXAPRO) 10 MG tablet Take 10 mg by mouth daily. 08/30/21   [provider]  fluticasone  (FLONASE ) 50 MCG/ACT nasal spray Place 2 sprays into both nostrils daily. 10/11/23   Van Knee, MD  naproxen  (NAPROSYN ) 500 MG tablet Take 1 tablet (500 mg total) by mouth 2 (two) times daily. 10/11/23   Mortenson, Ashley, MD  promethazine -dextromethorphan (PROMETHAZINE -DM) 6.25-15 MG/5ML syrup Take 5 mLs by mouth 4 (four) times daily as needed for cough. 10/11/23   Van Knee, MD    Family History Family History  Problem Relation Age of Onset   Dysmenorrhea Mother    Anxiety disorder Father    Breast cancer Neg Hx    Ovarian cancer Neg Hx  Social History Social History   Tobacco Use   Smoking status: Never   Smokeless tobacco: Never  Vaping Use   Vaping status: Never Used  Substance Use Topics   Alcohol use: Never   Drug use: Never     Allergies   Patient has no known allergies.   Review of Systems Review of Systems :negative unless otherwise stated in HPI.      Physical Exam Triage Vital Signs ED Triage Vitals  Encounter Vitals Group     BP 06/14/24 1140 112/74     Girls Systolic BP Percentile --      Girls Diastolic BP Percentile --      Boys Systolic BP Percentile --      Boys Diastolic BP Percentile --      Pulse Rate 06/14/24 1140 61     Resp 06/14/24 1140 16     Temp 06/14/24 1140 98.6 F (37 C)     Temp Source 06/14/24 1140 Oral     SpO2 06/14/24 1140 100 %     Weight --      Height --      Head Circumference --      Peak Flow --      Pain Score 06/14/24 1139 0     Pain Loc --      Pain Education  --      Exclude from Growth Chart --    No data found.  Updated Vital Signs BP 112/74 (BP Location: Left Arm)   Pulse 61   Temp 98.6 F (37 C) (Oral)   Resp 16   LMP 06/09/2024   SpO2 100%   Visual Acuity Right Eye Distance:   Left Eye Distance:   Bilateral Distance:    Right Eye Near:   Left Eye Near:    Bilateral Near:     Physical Exam  GEN: alert, well appearing female, in no acute distress  EYES: no scleral injection or discharge CV: regular rate, brisk cap refill  RESP: no increased work of breathing, clear to ascultation bilaterally MSK: no extremity edema, normal ROM BLE NEURO: alert, moves all extremities appropriately SKIN: warm and dry; blistering erythematous patches and papules on BLE       UC Treatments / Results  Labs (all labs ordered are listed, but only abnormal results are displayed) Labs Reviewed - No data to display  EKG   Radiology No results found.  Procedures Procedures (including critical care time)  Medications Ordered in UC Medications - No data to display  Initial Impression / Assessment and Plan / UC Course  I have reviewed the triage vital signs and the nursing notes.  Pertinent labs & imaging results that were available during my care of the patient were reviewed by me and considered in my medical decision making (see chart for details).      Patient is a 23 y.o. female who presents for worsening rash for the week.  Overall, patient is well-appearing and well-hydrated.  Vital signs stable.  Ashley Rodgers is afebrile.  History and exam concerning for contact dermatitis but possibly insect driven dermatitis. Treat possible contact dermatitis with prednisone  taper and steroid ointment. Treat secondary bacterial infection with doxycyline and mupirocin .  Considered scabies but will hold off with this treatment for now.  Claritin or Zyrtec twice a day for additional itch relief. No sign of infection to suggest antifungals at  this time.    Reviewed expectations regarding course of current medical issues.  All questions asked were answered.  Outlined signs and symptoms indicating need for more acute intervention. Patient verbalized understanding. After Visit Summary given.   Final Clinical Impressions(s) / UC Diagnoses   Final diagnoses:  Irritant contact dermatitis due to oils  Rash     Discharge Instructions      Stop by the pharmacy to pick up your prescriptions.  Follow up with your primary care provider or return to the urgent care, if not improving.       ED Prescriptions     Medication Sig Dispense Auth. Provider   predniSONE  (STERAPRED UNI-PAK 21 TAB) 10 MG (21) TBPK tablet Take by mouth daily. Take 6 tabs by mouth daily for 1, then 5 tabs for 1 day, then 4 tabs for 1 day, then 3 tabs for 1 day, then 2 tabs for 1 day, then 1 tab for 1 day. 21 tablet Chidubem Chaires, DO   triamcinolone  ointment (KENALOG ) 0.1 % Apply 1 Application topically 2 (two) times daily. 30 g Lauranne Beyersdorf, DO   mupirocin  ointment (BACTROBAN ) 2 % Apply 1 Application topically 2 (two) times daily. 22 g Naysha Sholl, DO   doxycycline  (VIBRAMYCIN ) 100 MG capsule Take 1 capsule (100 mg total) by mouth 2 (two) times daily. 20 capsule Kaemon Barnett, DO      PDMP not reviewed this encounter.              Deshonda Cryderman, DO 06/16/24 1133

## 2024-06-14 NOTE — Discharge Instructions (Addendum)
 Stop by the pharmacy to pick up your prescriptions.  Follow up with your primary care provider or return to the urgent care, if not improving.
